# Patient Record
Sex: Male | Born: 1975 | Race: White | Hispanic: No | Smoking: Current every day smoker
Health system: Southern US, Community
[De-identification: ages and names within clinical notes are randomized; demographics above are authoritative.]

---

## 2005-01-02 ENCOUNTER — Emergency Department (HOSPITAL_COMMUNITY): Admission: EM | Admit: 2005-01-02 | Discharge: 2005-01-02 | Payer: Self-pay | Admitting: Family Medicine

## 2009-08-25 ENCOUNTER — Encounter: Payer: Self-pay | Admitting: Emergency Medicine

## 2009-08-25 ENCOUNTER — Inpatient Hospital Stay (HOSPITAL_COMMUNITY): Admission: EM | Admit: 2009-08-25 | Discharge: 2009-08-26 | Payer: Self-pay | Admitting: Internal Medicine

## 2009-08-25 ENCOUNTER — Ambulatory Visit: Payer: Self-pay | Admitting: Diagnostic Radiology

## 2010-06-15 LAB — POCT TOXICOLOGY PANEL: Cocaine: POSITIVE

## 2010-06-15 LAB — COMPREHENSIVE METABOLIC PANEL
ALT: 14 U/L (ref 0–53)
AST: 18 U/L (ref 0–37)
Albumin: 3 g/dL — ABNORMAL LOW (ref 3.5–5.2)
Alkaline Phosphatase: 52 U/L (ref 39–117)
BUN: 9 mg/dL (ref 6–23)
CO2: 26 mEq/L (ref 19–32)
Calcium: 8.4 mg/dL (ref 8.4–10.5)
Chloride: 108 mEq/L (ref 96–112)
Creatinine, Ser: 1.02 mg/dL (ref 0.4–1.5)
GFR calc Af Amer: >60 mL/min (ref >60)
GFR calc non Af Amer: >60 mL/min (ref >60)
Glucose, Bld: 107 mg/dL — ABNORMAL HIGH (ref 70–99)
Potassium: 3.6 mEq/L (ref 3.5–5.1)
Sodium: 137 mEq/L (ref 135–145)
Total Bilirubin: 0.9 mg/dL (ref 0.3–1.2)
Total Protein: 5.2 g/dL — ABNORMAL LOW (ref 6.0–8.3)

## 2010-06-15 LAB — CBC
HCT: 39.7 % (ref 39.0–52.0)
HCT: 42.5 % (ref 39.0–52.0)
Hemoglobin: 13.6 g/dL (ref 13.0–17.0)
Hemoglobin: 14.3 g/dL (ref 13.0–17.0)
MCHC: 33.6 g/dL (ref 30.0–36.0)
MCHC: 34.4 g/dL (ref 30.0–36.0)
MCV: 96.6 fL (ref 78.0–100.0)
MCV: 99.2 fL (ref 78.0–100.0)
Platelets: 197 10*3/uL (ref 150–400)
Platelets: 265 10*3/uL (ref 150–400)
RBC: 4 MIL/uL — ABNORMAL LOW (ref 4.22–5.81)
RBC: 4.4 MIL/uL (ref 4.22–5.81)
RDW: 11.9 % (ref 11.5–15.5)
RDW: 12.7 % (ref 11.5–15.5)
WBC: 6.7 10*3/uL (ref 4.0–10.5)
WBC: 8.1 10*3/uL (ref 4.0–10.5)

## 2010-06-15 LAB — HEPATIC FUNCTION PANEL
ALT: 13 U/L (ref 0–53)
AST: 27 U/L (ref 0–37)
Albumin: 4 g/dL (ref 3.5–5.2)
Alkaline Phosphatase: 69 U/L (ref 39–117)
Bilirubin, Direct: 0 mg/dL (ref 0.0–0.3)
Indirect Bilirubin: 0.4 mg/dL (ref 0.3–0.9)
Total Bilirubin: 0.4 mg/dL (ref 0.3–1.2)
Total Protein: 7.1 g/dL (ref 6.0–8.3)

## 2010-06-15 LAB — BASIC METABOLIC PANEL
BUN: 7 mg/dL (ref 6–23)
CO2: 25 mEq/L (ref 19–32)
Calcium: 9 mg/dL (ref 8.4–10.5)
Chloride: 106 mEq/L (ref 96–112)
Creatinine, Ser: 1.1 mg/dL (ref 0.4–1.5)
GFR calc Af Amer: >60 mL/min (ref >60)
GFR calc non Af Amer: >60 mL/min (ref >60)
Glucose, Bld: 135 mg/dL — ABNORMAL HIGH (ref 70–99)
Potassium: 3.5 mEq/L (ref 3.5–5.1)
Sodium: 146 mEq/L — ABNORMAL HIGH (ref 135–145)

## 2010-06-15 LAB — DIFFERENTIAL
Basophils Absolute: 0.1 10*3/uL (ref 0.0–0.1)
Basophils Absolute: 0.1 10*3/uL (ref 0.0–0.1)
Basophils Relative: 1 % (ref 0–1)
Basophils Relative: 1 % (ref 0–1)
Eosinophils Absolute: 0 10*3/uL (ref 0.0–0.7)
Eosinophils Absolute: 0.5 10*3/uL (ref 0.0–0.7)
Eosinophils Relative: 0 % (ref 0–5)
Eosinophils Relative: 7 % — ABNORMAL HIGH (ref 0–5)
Lymphocytes Relative: 15 % (ref 12–46)
Lymphocytes Relative: 35 % (ref 12–46)
Lymphs Abs: 1.2 10*3/uL (ref 0.7–4.0)
Lymphs Abs: 2.4 10*3/uL (ref 0.7–4.0)
Monocytes Absolute: 0.4 10*3/uL (ref 0.1–1.0)
Monocytes Absolute: 0.6 10*3/uL (ref 0.1–1.0)
Monocytes Relative: 10 % (ref 3–12)
Monocytes Relative: 5 % (ref 3–12)
Neutro Abs: 3.2 10*3/uL (ref 1.7–7.7)
Neutro Abs: 6.4 10*3/uL (ref 1.7–7.7)
Neutrophils Relative %: 47 % (ref 43–77)
Neutrophils Relative %: 79 % — ABNORMAL HIGH (ref 43–77)

## 2010-06-15 LAB — CARDIAC PANEL(CRET KIN+CKTOT+MB+TROPI)
CK, MB: 1 ng/mL (ref 0.3–4.0)
CK, MB: 1 ng/mL (ref 0.3–4.0)
CK, MB: 1.1 ng/mL (ref 0.3–4.0)
Relative Index: 0.7 (ref 0.0–2.5)
Relative Index: 0.7 (ref 0.0–2.5)
Relative Index: 1 (ref 0.0–2.5)
Total CK: 114 U/L (ref 7–232)
Total CK: 134 U/L (ref 7–232)
Total CK: 139 U/L (ref 7–232)
Troponin I: 0.01 ng/mL (ref 0.00–0.06)
Troponin I: <0.01 ng/mL (ref 0.00–0.06)
Troponin I: <0.01 ng/mL (ref 0.00–0.06)

## 2010-06-15 LAB — POCT CARDIAC MARKERS
CKMB, poc: <1 ng/mL — ABNORMAL LOW (ref 1.0–8.0)
CKMB, poc: <1 ng/mL — ABNORMAL LOW (ref 1.0–8.0)
CKMB, poc: <1 ng/mL — ABNORMAL LOW (ref 1.0–8.0)
Myoglobin, poc: 29.6 ng/mL (ref 12–200)
Myoglobin, poc: 30 ng/mL (ref 12–200)
Myoglobin, poc: 54.7 ng/mL (ref 12–200)
Troponin i, poc: <0.05 ng/mL (ref 0.00–0.09)
Troponin i, poc: <0.05 ng/mL (ref 0.00–0.09)
Troponin i, poc: <0.05 ng/mL (ref 0.00–0.09)

## 2010-06-15 LAB — MRSA PCR SCREENING: MRSA by PCR: NEGATIVE

## 2010-06-15 LAB — MAGNESIUM: Magnesium: 1.9 mg/dL (ref 1.5–2.5)

## 2010-06-15 LAB — GLUCOSE, CAPILLARY: Glucose-Capillary: 98 mg/dL (ref 70–99)

## 2010-06-15 LAB — ETHANOL: Alcohol, Ethyl (B): 156 mg/dL — ABNORMAL HIGH (ref 0–10)

## 2011-12-17 IMAGING — CR DG CHEST 1V PORT
1 series · 1 of 1 positions shown · non-contrast
Comparison: None.

CLINICAL DATA: Chest pain.

PORTABLE CHEST - 1 VIEW

[view not recorded]
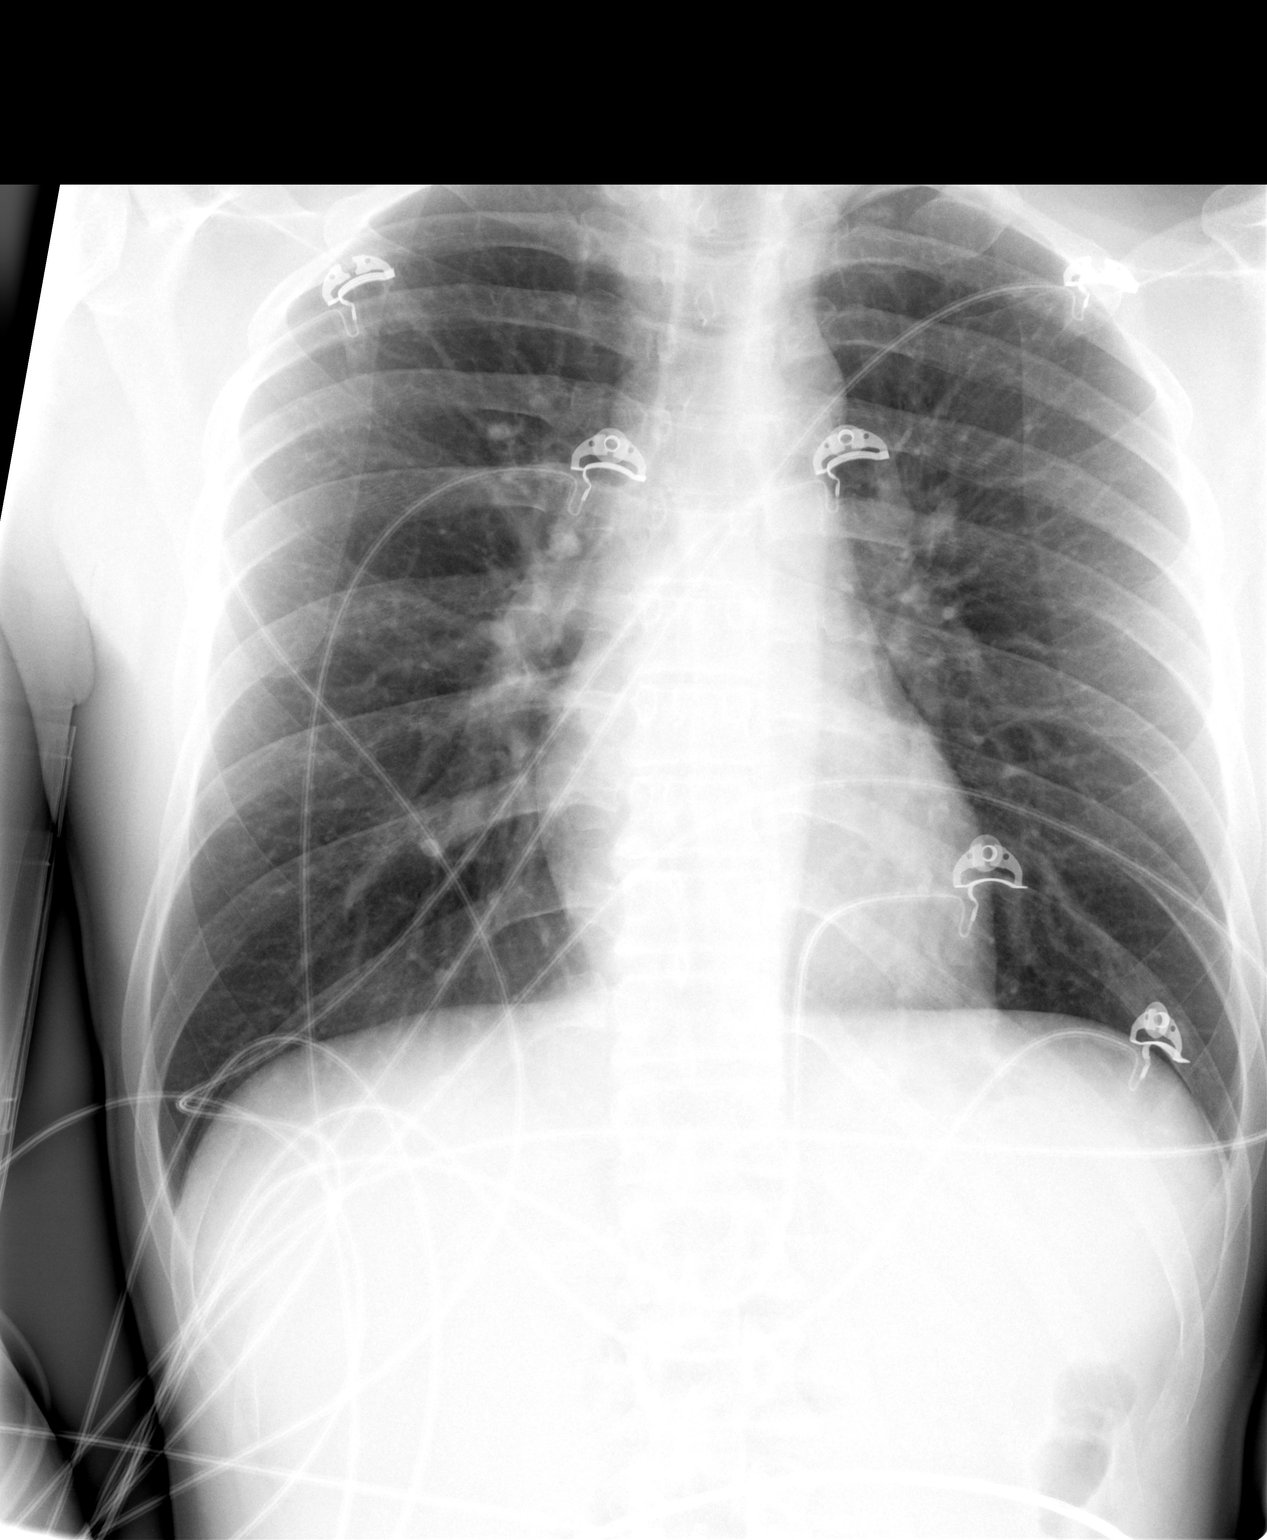

[1 of 1 positions shown; findings below may reference images not displayed]

FINDINGS: Lungs are clear.  No pneumothorax or pleural effusion.
Heart size normal.
IMPRESSION: No acute disease.

## 2016-10-30 DIAGNOSIS — K610 Anal abscess: Secondary | ICD-10-CM | POA: Insufficient documentation

## 2016-10-30 DIAGNOSIS — F17200 Nicotine dependence, unspecified, uncomplicated: Secondary | ICD-10-CM | POA: Insufficient documentation

## 2016-10-31 ENCOUNTER — Encounter (HOSPITAL_COMMUNITY): Payer: Self-pay | Admitting: Emergency Medicine

## 2016-10-31 ENCOUNTER — Emergency Department (HOSPITAL_COMMUNITY)
Admission: EM | Admit: 2016-10-31 | Discharge: 2016-10-31 | Discharge status: Against Medical Advice | Payer: Self-pay | Attending: Emergency Medicine | Admitting: Emergency Medicine

## 2016-10-31 DIAGNOSIS — L0291 Cutaneous abscess, unspecified: Secondary | ICD-10-CM

## 2016-10-31 MED ORDER — KETOROLAC TROMETHAMINE 30 MG/ML IJ SOLN
30.0000 mg | Freq: Once | INTRAMUSCULAR | Status: AC
  Administered 2016-10-31: 30 mg via INTRAMUSCULAR
  Filled 2016-10-31: qty 1

## 2016-10-31 MED ORDER — DOXYCYCLINE HYCLATE 100 MG PO CAPS: 100.0000 mg | ORAL_CAPSULE | Freq: Two times a day (BID) | ORAL | 0 refills | Status: DC

## 2016-10-31 MED ORDER — LIDOCAINE HCL (PF) 1 % IJ SOLN
5.0000 mL | Freq: Once | INTRAMUSCULAR | Status: DC
  Filled 2016-10-31: qty 30

## 2016-10-31 MED ORDER — LIDOCAINE-EPINEPHRINE (PF) 2 %-1:200000 IJ SOLN
INTRAMUSCULAR | Status: AC
  Administered 2016-10-31: 06:00:00
  Filled 2016-10-31: qty 20

## 2016-10-31 MED ORDER — OXYCODONE-ACETAMINOPHEN 5-325 MG PO TABS
1.0000 | ORAL_TABLET | Freq: Once | ORAL | Status: AC
  Administered 2016-10-31: 1 via ORAL
  Filled 2016-10-31: qty 1

## 2016-10-31 NOTE — ED Notes (Signed)
Bed: [facility] Expected date:  Expected time:  Means of arrival:  Comments: 

## 2016-10-31 NOTE — ED Provider Notes (Signed)
[facility]-EMERGENCY DEPT Provider Note   CSN: [id number] Arrival date & time: 10/30/16  2245     History   Chief Complaint Chief Complaint  Patient presents with  . Abscess    HPI Allen Flores is a 41 y.o. male.  HPI 41 year old Caucasian male with no significant past medical history presents to the ED today with complaints of abscess to his left buttocks. Patient states that he knows the area 3-4 days ago has gradually worsened. Reports drainage from the site. Reports erythema and pain. Denies any pain with defecation. Denies any rectal bleeding. The patient has not tried anything for his symptoms prior to arrival. Palpation and sitting makes the pain worse. He denies any associated symptoms including fever, chills, abdominal pain, nausea, emesis, diarrhea. History reviewed. No pertinent past medical history.  There are no active problems to display for this patient.   History reviewed. No pertinent surgical history.     Home Medications    Prior to Admission medications   Medication Sig Start Date End Date Taking? Authorizing Provider  doxycycline (VIBRAMYCIN) 100 MG capsule Take 1 capsule (100 mg total) by mouth 2 (two) times daily. 10/31/16   Rise MuLeaphart, Kenneth T, PA-C    Family History History reviewed. No pertinent family history.  Social History Social History  Substance Use Topics  . Smoking status: Current Every Day Smoker  . Smokeless tobacco: Never Used  . Alcohol use No     Allergies   Patient has no known allergies.   Review of Systems Review of Systems  Constitutional: Negative for chills and fever.  Gastrointestinal: Negative for abdominal pain, blood in stool, nausea, rectal pain and vomiting.  Skin: Positive for wound.     Physical Exam Updated Vital Signs BP 124/82 (BP Location: Left Arm)   Pulse 79   Temp 98.3 F (36.8 C) (Oral)   Resp 20   Ht 5\' 9"  (1.753 m)   Wt 68 kg (150 lb)   SpO2 95%   BMI 22.15 kg/m   Physical Exam    Constitutional: He appears well-developed and well-nourished. No distress.  HENT:  Head: Normocephalic and atraumatic.  Eyes: Right eye exhibits no discharge. Left eye exhibits no discharge. No scleral icterus.  Neck: Normal range of motion.  Pulmonary/Chest: No respiratory distress.  Genitourinary: Rectum normal.     Genitourinary Comments: A tender to palpation.  Musculoskeletal: Normal range of motion.  Neurological: He is alert.  Skin: Skin is warm and dry. Capillary refill takes less than 2 seconds. No pallor.  Psychiatric: His behavior is normal. Judgment and thought content normal.  Nursing note and vitals reviewed.    ED Treatments / Results  Labs (all labs ordered are listed, but only abnormal results are displayed) Labs Reviewed - No data to display  EKG  EKG Interpretation None       Radiology No results found.  Procedures .Marland Kitchen.Incision and Drainage Date/Time: 10/31/2016 7:01 AM Performed by: Demetrios LollLEAPHART, KENNETH T Authorized by: Demetrios LollLEAPHART, KENNETH T   Consent:    Consent obtained:  Verbal   Consent given by:  Patient   Risks discussed:  Bleeding, damage to other organs, incomplete drainage, infection and pain   Alternatives discussed:  No treatment Location:    Type:  Abscess   Size:  4x3cm   Location:  Anogenital   Anogenital location:  Perianal Pre-procedure details:    Skin preparation:  Betadine Anesthesia (see MAR for exact dosages):    Anesthesia method:  Local infiltration  Local anesthetic:  Lidocaine 1% WITH epi Procedure type:    Complexity:  Simple Procedure details:    Needle aspiration: no     Incision types:  Single straight   Incision depth:  Dermal   Scalpel blade:  11   Wound management:  Probed and deloculated, irrigated with saline and extensive cleaning   Drainage:  Bloody and purulent   Drainage amount:  Moderate   Wound treatment:  Wound left open   Packing materials:  1/4 in iodoform gauze Post-procedure details:     Patient tolerance of procedure:  Tolerated well, no immediate complications   (including critical care time)  Medications Ordered in ED Medications  lidocaine (PF) (XYLOCAINE) 1 % injection 5 mL (5 mLs Infiltration Not Given 10/31/16 0602)  lidocaine-EPINEPHrine (XYLOCAINE W/EPI) 2 %-1:200000 (PF) injection (  Given 10/31/16 0602)  oxyCODONE-acetaminophen (PERCOCET/ROXICET) 5-325 MG per tablet 1 tablet (1 tablet Oral Given 10/31/16 [id number])  ketorolac (TORADOL) 30 MG/ML injection 30 mg (30 mg Intramuscular Given 10/31/16 [id number])     Initial Impression / Assessment and Plan / ED Course  I have reviewed the triage vital signs and the nursing notes.  Pertinent labs & imaging results that were available during my care of the patient were reviewed by me and considered in my medical decision making (see chart for details).     Patient with skin abscess amenable to incision and drainage. There is no rectal involvement. No pain to palpation in the rectal vault. No pain with defecation. Doubt perirectal abscess. Abscess was large enough to warrant packing or drain,  wound recheck in 2 days. Abscess was irrigated. Signs of surrounding cellulitis. Patient was started on antibiotics with wound recheck in 2 days or sooner if symptoms worsen. Encouraged home warm soaks and flushing.   Pt is hemodynamically stable, in NAD, & able to ambulate in the ED. Evaluation does not show pathology that would require ongoing emergent intervention or inpatient treatment. I explained the diagnosis to the patient. Pain has been managed & has no complaints prior to dc. Pt is comfortable with above plan and is stable for discharge at this time. All questions were answered prior to disposition. Strict return precautions for f/u to the ED were discussed. Encouraged follow up with PCP. Dicussed pt with Dr. Lynelle DoctorKnapp who is agreeable to the above plan.   Final Clinical Impressions(s) / ED Diagnoses   Final diagnoses:  Abscess    New  Prescriptions Discharge Medication List as of 10/31/2016  6:16 AM    START taking these medications   Details  doxycycline (VIBRAMYCIN) 100 MG capsule Take 1 capsule (100 mg total) by mouth 2 (two) times daily., Starting Sun 10/31/2016, Print         Rise MuLeaphart, Kenneth T, PA-C 10/31/16 [id number]    Devoria AlbeKnapp, Iva, MD 10/31/16 [phone]

## 2016-10-31 NOTE — ED Notes (Signed)
No reaction to medication noted alert and oriented x 3 call light in reach no respiratory or acute distress noted. 

## 2016-10-31 NOTE — ED Notes (Signed)
No respiratory or acute distress noted alert and oriented x 3 call light in reach no pain voiced at this time.

## 2016-10-31 NOTE — ED Triage Notes (Signed)
Pt states he has an abscess on his buttock area that started 3 - 4 days ago

## 2016-10-31 NOTE — Discharge Instructions (Signed)
You have been treated for an abscess in the ED.  ° °There are sign of surrounding infection. Please take all of your antibiotics until finished!   You may develop abdominal discomfort or diarrhea from the antibiotic. You may help offset this with probiotics which you can buy or get in yogurt.  ° °May take tylenol and motrin as needed for pain. Warm compress to the area to help with the healing process. Avoid swimming for 1 week. May soak in warm water to help with pain and the healing process.  ° °Follow up with your doctor, an urgent care, or return to ED in order to remove your packing in 48-72 hours. If you do not have packing return in 48-72 hours for wound recheck. Return to the emergency department if you develop a fever, your abscess appears to become more infected (growing surrounding redness and warmth), new or worsening symptoms develop, any additional concerns.  ° °Abscess °An abscess (boil or furuncle) is an infected area that contains a collection of pus.  ° °SYMPTOMS °Signs and symptoms of an abscess include pain, tenderness, redness, or hardness. You may feel a moveable soft area under your skin. An abscess can occur anywhere in the body.  ° °TREATMENT  °A surgical cut (incision) may be made over your abscess to drain the pus. Gauze may be packed into the space or a drain may be looped through the abscess cavity (pocket). This provides a drain that will allow the cavity to heal from the inside outwards. The abscess may be painful for a few days, but should feel much better if it was drained.  °Your abscess, if seen early, may not have localized and may not have been drained. If not, another appointment may be required if it does not get better on its own or with medications. ° °HOME CARE INSTRUCTIONS  °Keep the skin and clothes clean around your abscess.  °If the abscess was drained, you will need to use gauze dressing to collect any draining pus. Dressings will typically need to be changed 3 or more  times a day.  °The infection may spread by skin contact with others. Avoid skin contact as much as possible.  °Practice good hygiene. This includes regular hand washing, cover any draining skin lesions, and do not share personal care items.  °SEEK MEDICAL CARE IF:  °You develop increased pain, swelling, redness, drainage, or bleeding in the wound site.  °You develop signs of generalized infection including muscle aches, chills, fever, or a general ill feeling.  °You have an oral temperature above 102° F (38.9° C).  °MAKE SURE YOU:  °Understand these instructions.  °Will watch your condition.  °Will get help right away if you are not doing well or get worse.  °Document Released: 12/23/2004 Document Revised: 11/25/2010 Document Reviewed: 10/17/2007 °[employer]® Patient Information ©2012 [employer]. ° ° ° °

## 2017-04-21 ENCOUNTER — Ambulatory Visit (HOSPITAL_COMMUNITY)
Admission: EM | Admit: 2017-04-21 | Discharge: 2017-04-21 | Disposition: A | Discharge status: Home / Self Care | Payer: Self-pay | Attending: Family Medicine | Admitting: Family Medicine

## 2017-04-21 ENCOUNTER — Encounter (HOSPITAL_COMMUNITY): Payer: Self-pay | Admitting: Emergency Medicine

## 2017-04-21 DIAGNOSIS — R69 Illness, unspecified: Secondary | ICD-10-CM

## 2017-04-21 DIAGNOSIS — J111 Influenza due to unidentified influenza virus with other respiratory manifestations: Secondary | ICD-10-CM

## 2017-04-21 MED ORDER — OSELTAMIVIR PHOSPHATE 75 MG PO CAPS: 75.0000 mg | ORAL_CAPSULE | Freq: Two times a day (BID) | ORAL | 0 refills | Status: DC

## 2017-04-21 MED ORDER — HYDROCODONE-HOMATROPINE 5-1.5 MG/5ML PO SYRP: 5.0000 mL | ORAL_SOLUTION | Freq: Four times a day (QID) | ORAL | 0 refills | Status: DC | PRN

## 2017-04-21 NOTE — ED Triage Notes (Signed)
PT reports  A chronic cough.  PT reports last night he developed a fever headaches, body aches, and vomiting.

## 2017-04-21 NOTE — Discharge Instructions (Signed)

## 2017-04-26 NOTE — ED Provider Notes (Signed)
  [facility]   [id number] 04/21/17 Arrival Time: 1505  ASSESSMENT & PLAN:  1. Influenza-like illness     Meds ordered this encounter  Medications  . oseltamivir (TAMIFLU) 75 MG capsule    Sig: Take 1 capsule (75 mg total) by mouth every 12 (twelve) hours.    Dispense:  10 capsule    Refill:  0  . HYDROcodone-homatropine (HYCODAN) 5-1.5 MG/5ML syrup    Sig: Take 5 mLs by mouth every 6 (six) hours as needed for cough.    Dispense:  90 mL    Refill:  0   Cough medication sedation precautions. Discussed typical duration of symptoms. OTC symptom care as needed. Ensure adequate fluid intake and rest. May f/u with PCP or here as needed.  Reviewed expectations re: course of current medical issues. Questions answered. Outlined signs and symptoms indicating need for more acute intervention. Patient verbalized understanding. After Visit Summary given.   SUBJECTIVE: History from: patient.  Allen Flores is a 42 y.o. male who presents with complaint of nasal congestion, post-nasal drainage, and a persistent dry cough. Onset abrupt, approximately 1 day ago. Overall fatigued with body aches. SOB: none. Wheezing: none. Fever: questions subjective with headache. Overall normal PO intake without n/v. Sick contacts: no. OTC treatment: none. No flu shot this year.  Social History   Tobacco Use  Smoking Status Current Every Day Smoker  . Packs/day: 0.50  Smokeless Tobacco Never Used    ROS: As per HPI.   OBJECTIVE:  Vitals:   04/21/17 1540  BP: 133/67  Pulse: (!) 118  Resp: 16  Temp: (!) 101.3 F (38.5 C)  TempSrc: Oral  SpO2: 94%  Weight: 144 lb (65.3 kg)  Height: 5\' 9"  (1.753 m)     General appearance: alert; appears fatigued HEENT: nasal congestion; clear runny nose; throat irritation secondary to post-nasal drainage Neck: supple without LAD Lungs: unlabored respirations, symmetrical air entry; cough: moderate; no respiratory distress Skin: warm and  dry Psychological: alert and cooperative; normal mood and affect  No Known Allergies  Social History   Socioeconomic History  . Marital status: Divorced    Spouse name: Not on file  . Number of children: Not on file  . Years of education: Not on file  . Highest education level: Not on file  Social Needs  . Financial resource strain: Not on file  . Food insecurity - worry: Not on file  . Food insecurity - inability: Not on file  . Transportation needs - medical: Not on file  . Transportation needs - non-medical: Not on file  Occupational History  . Not on file  Tobacco Use  . Smoking status: Current Every Day Smoker    Packs/day: 0.50  . Smokeless tobacco: Never Used  Substance and Sexual Activity  . Alcohol use: Yes  . Drug use: No  . Sexual activity: Not on file  Other Topics Concern  . Not on file  Social History Narrative  . Not on file           Mardella LaymanHagler, Temesha Queener, MD 04/26/17 [phone]

## 2020-02-14 ENCOUNTER — Other Ambulatory Visit: Payer: Self-pay

## 2020-02-14 DIAGNOSIS — Z20822 Contact with and (suspected) exposure to covid-19: Secondary | ICD-10-CM

## 2020-02-15 LAB — SARS-COV-2, NAA 2 DAY TAT

## 2020-02-15 LAB — NOVEL CORONAVIRUS, NAA: SARS-CoV-2, NAA: NOT DETECTED

## 2021-12-06 ENCOUNTER — Other Ambulatory Visit: Payer: Self-pay

## 2021-12-06 ENCOUNTER — Emergency Department (HOSPITAL_COMMUNITY)
Admission: EM | Admit: 2021-12-06 | Discharge: 2021-12-06 | Disposition: A | Discharge status: Home / Self Care | Payer: Self-pay | Attending: Emergency Medicine | Admitting: Emergency Medicine

## 2021-12-06 ENCOUNTER — Encounter (HOSPITAL_COMMUNITY): Payer: Self-pay | Admitting: Emergency Medicine

## 2021-12-06 ENCOUNTER — Emergency Department (HOSPITAL_COMMUNITY): Payer: Self-pay

## 2021-12-06 DIAGNOSIS — W540XXA Bitten by dog, initial encounter: Secondary | ICD-10-CM | POA: Insufficient documentation

## 2021-12-06 DIAGNOSIS — T148XXA Other injury of unspecified body region, initial encounter: Secondary | ICD-10-CM

## 2021-12-06 DIAGNOSIS — L03116 Cellulitis of left lower limb: Secondary | ICD-10-CM

## 2021-12-06 LAB — CBC WITH DIFFERENTIAL/PLATELET
Abs Immature Granulocytes: 0.07 10*3/uL (ref 0.00–0.07)
Basophils Absolute: 0.1 10*3/uL (ref 0.0–0.1)
Basophils Relative: 0 %
Eosinophils Absolute: 0.2 10*3/uL (ref 0.0–0.5)
Eosinophils Relative: 2 %
HCT: 42.9 % (ref 39.0–52.0)
Hemoglobin: 14.3 g/dL (ref 13.0–17.0)
Immature Granulocytes: 1 %
Lymphocytes Relative: 11 %
Lymphs Abs: 1.3 10*3/uL (ref 0.7–4.0)
MCH: 33.5 pg (ref 26.0–34.0)
MCHC: 33.3 g/dL (ref 30.0–36.0)
MCV: 100.5 fL — ABNORMAL HIGH (ref 80.0–100.0)
Monocytes Absolute: 1.1 10*3/uL — ABNORMAL HIGH (ref 0.1–1.0)
Monocytes Relative: 9 %
Neutro Abs: 9.1 10*3/uL — ABNORMAL HIGH (ref 1.7–7.7)
Neutrophils Relative %: 77 %
Platelets: 248 10*3/uL (ref 150–400)
RBC: 4.27 MIL/uL (ref 4.22–5.81)
RDW: 12.2 % (ref 11.5–15.5)
WBC: 11.8 10*3/uL — ABNORMAL HIGH (ref 4.0–10.5)
nRBC: 0 % (ref 0.0–0.2)

## 2021-12-06 LAB — COMPREHENSIVE METABOLIC PANEL
ALT: 19 U/L (ref 0–44)
AST: 19 U/L (ref 15–41)
Albumin: 4.2 g/dL (ref 3.5–5.0)
Alkaline Phosphatase: 64 U/L (ref 38–126)
Anion gap: 7 (ref 5–15)
BUN: 16 mg/dL (ref 6–20)
CO2: 25 mmol/L (ref 22–32)
Calcium: 8.9 mg/dL (ref 8.9–10.3)
Chloride: 107 mmol/L (ref 98–111)
Creatinine, Ser: 0.94 mg/dL (ref 0.61–1.24)
GFR, Estimated: >60 mL/min (ref >60)
Glucose, Bld: 100 mg/dL — ABNORMAL HIGH (ref 70–99)
Potassium: 3.6 mmol/L (ref 3.5–5.1)
Sodium: 139 mmol/L (ref 135–145)
Total Bilirubin: 0.6 mg/dL (ref 0.3–1.2)
Total Protein: 7.2 g/dL (ref 6.5–8.1)

## 2021-12-06 MED ORDER — BACITRACIN ZINC 500 UNIT/GM EX OINT
TOPICAL_OINTMENT | CUTANEOUS | Status: AC
  Filled 2021-12-06: qty 1.8

## 2021-12-06 MED ORDER — AMOXICILLIN-POT CLAVULANATE 875-125 MG PO TABS: 1.0000 | ORAL_TABLET | Freq: Two times a day (BID) | ORAL | 0 refills | Status: AC

## 2021-12-06 MED ORDER — MUPIROCIN 2 % EX OINT: 1.0000 | TOPICAL_OINTMENT | Freq: Two times a day (BID) | CUTANEOUS | 0 refills | Status: AC

## 2021-12-06 NOTE — ED Triage Notes (Signed)
Pt reports dog bite on right leg. Reports stepping on something w/ right foot as well. Leg is now swollen and red.

## 2021-12-06 NOTE — ED Provider Notes (Signed)
[facility]-EMERGENCY DEPT Provider Note   CSN: [id number] Arrival date & time: 12/06/21  1323     History  Chief Complaint  Patient presents with   Leg Pain   Animal Bite    Allen Flores is a 46 y.o. male.   Leg Pain Animal Bite   46 year old male presents emergency department with complaints of left leg swelling and pain.  Patient states that he was bit by a dog this past Wednesday.  Dog is up-to-date on rabies vaccination per patient.  He also notes he stepped on an object in the woods 2 days ago that caused a cut on the plantar aspect of his left foot.  Yesterday he noticed redness in his left calf that is since progressed.  Area is tender to the touch per patient.  Denies fever, chills, night sweats, weakness/sensory deficits in affected leg, drainage from sites.  He has been putting hydrogen peroxide as well as Neosporin on areas.  Past medical history significant for chronic cigarette use  Home Medications Prior to Admission medications   Medication Sig Start Date End Date Taking? Authorizing Provider  amoxicillin-clavulanate (AUGMENTIN) 875-125 MG tablet Take 1 tablet by mouth every 12 (twelve) hours. 12/06/21  Yes Sherian Maroon A, PA  mupirocin ointment (BACTROBAN) 2 % Apply 1 Application topically 2 (two) times daily. 12/06/21  Yes Peter Garter, PA      Allergies    Patient has no known allergies.    Review of Systems   Review of Systems  All other systems reviewed and are negative.   Physical Exam Updated Vital Signs BP 123/84   Pulse 98   Temp 98.3 F (36.8 C) (Oral)   Resp 16   SpO2 99%  Physical Exam Vitals and nursing note reviewed.  Constitutional:      General: He is not in acute distress.    Appearance: Normal appearance. He is well-developed.  HENT:     Head: Normocephalic and atraumatic.  Eyes:     Conjunctiva/sclera: Conjunctivae normal.  Cardiovascular:     Rate and Rhythm: Normal rate and regular rhythm.      Heart sounds: No murmur heard. Pulmonary:     Effort: Pulmonary effort is normal. No respiratory distress.     Breath sounds: Normal breath sounds.  Abdominal:     Palpations: Abdomen is soft.     Tenderness: There is no abdominal tenderness.  Musculoskeletal:        General: No swelling.     Cervical back: Neck supple.  Skin:    General: Skin is warm and dry.     Capillary Refill: Capillary refill takes less than 2 seconds.     Comments: Patient has 2.5 cm superficial abrasion on the plantar aspect of left foot.  No surrounding fluctuance, erythema.  Area is minimally tender to palpation.  Maceration of skin noted secondary to prolonged Band-Aid placement.  No active drainage from site.  Patient has 1.5 cm diameter puncture wound noted on the posterior aspect of superior left calf.  No active drainage from site.  Area relatively clean in appearance with no observable foreign body.  Area of erythema extends from said puncture site distally down to his mid calf.  Area is warm to the touch and tender to palpation.  No obvious palpable nodule cord.  No obvious area of fluctuance/indurated skin. Patient is full active range of motion of lower extremity.  Dorsalis pedis pulses full and intact bilaterally.  Patient claims no  sensory deficits along major nerve distributions of lower extremities.  Compartments soft and supple.  Neurological:     Mental Status: He is alert.  Psychiatric:        Mood and Affect: Mood normal.     ED Results / Procedures / Treatments   Labs (all labs ordered are listed, but only abnormal results are displayed) Labs Reviewed  COMPREHENSIVE METABOLIC PANEL - Abnormal; Notable for the following components:      Result Value   Glucose, Bld 100 (*)    All other components within normal limits  CBC WITH DIFFERENTIAL/PLATELET - Abnormal; Notable for the following components:   WBC 11.8 (*)    MCV 100.5 (*)    Neutro Abs 9.1 (*)    Monocytes Absolute 1.1 (*)    All  other components within normal limits    EKG None  Radiology DG Foot Complete Left  Result Date: 12/06/2021 CLINICAL DATA:  Puncture wound along the medial plantar aspect of foot. Question foreign body EXAM: LEFT FOOT - COMPLETE 3 VIEW COMPARISON:  None Available. FINDINGS: There is no evidence of fracture or dislocation. There is no evidence of arthropathy or other focal bone abnormality. Soft tissues are unremarkable. No radiopaque foreign body is present. IMPRESSION: Negative left foot radiographs. Electronically Signed   By: Marin Roberts M.D.   On: 12/06/2021 14:19    Procedures Procedures    Medications Ordered in ED Medications  bacitracin 500 UNIT/GM ointment (has no administration in time range)    ED Course/ Medical Decision Making/ A&P                           Medical Decision Making Amount and/or Complexity of Data Reviewed Labs: ordered. Radiology: ordered.   This patient presents to the ED for concern of left leg pain, this involves an extensive number of treatment options, and is a complaint that carries with it a high risk of complications and morbidity.  The differential diagnosis includes foreign body, abscess, erysipelas, cellulitis, DVT, PAD, necrotizing fasciitis, compartment syndrome, fracture, strain/sprain   Co morbidities that complicate the patient evaluation  See HPI   Additional history obtained:  Additional history obtained from EMR   Lab Tests:  I Ordered, and personally interpreted labs.  The pertinent results include: Mild leukocytosis of 11.8 secondary to clinical diagnosis of cellulitis.  No electrolyte abnormality noted.  Renal function within normal limits.  No transaminitis noted.  No evidence of anemia.  Platelets within normal range.   Imaging Studies ordered:  I ordered imaging studies including left foot x-ray I independently visualized and interpreted imaging which showed no obvious foreign body I agree with the  radiologist interpretation   Cardiac Monitoring: / EKG:  The patient was maintained on a cardiac monitor.  I personally viewed and interpreted the cardiac monitored which showed an underlying rhythm of: Sinus rhythm   Consultations Obtained:  N/a   Problem List / ED Course / Critical interventions / Medication management  Cellulitis I ordered medication including bacitracin for topical antibiotic therapy   Reevaluation of the patient after these medicines showed that the patient improved I have reviewed the patients home medicines and have made adjustments as needed   Social Determinants of Health:  Chronic cigarette use   Test / Admission - Considered:  Dog bite/cellulitis Vitals signs within normal range and stable throughout visit. Laboratory/imaging studies significant for: See above Patient symptoms likely secondary to use bacterial skin  infection secondary to puncture wound obtained a few days ago.  No obvious clinical suspicion for DVT/PAD given lack of lower extremity edema or palpable nodule/cord.  Area of erythema extends from bite wound as described above.  No obvious areas of fluctuance indicating abscess.  Area was cleaned thoroughly while in the emergency department with deep wound cleanser.  Topical antibiotic ointment was applied.  Patient to be prescribed oral antibiotics of Augmentin outpatient for broad-spectrum coverage of oral flora of canine.  Topical bacitracin also prescribed for topical antibiotic therapy.  Patient recommended reevaluation in 2 to 3 days as well as tracking progression/recession of erythema.  Treatment plan discussed with patient he acknowledged understanding was agreeable to said plan. Worrisome signs and symptoms were discussed with the patient, and the patient acknowledged understanding to return to the ED if noticed. Patient was stable upon discharge.          Final Clinical Impression(s) / ED Diagnoses Final diagnoses:  Animal  bite  Cellulitis of left lower extremity    Rx / DC Orders ED Discharge Orders          Ordered    mupirocin ointment (BACTROBAN) 2 %  2 times daily        12/06/21 1844    amoxicillin-clavulanate (AUGMENTIN) 875-125 MG tablet  Every 12 hours        12/06/21 1844              Peter Garter, PA 12/06/21 1853    Gerhard Munch, MD 12/06/21 [phone]

## 2021-12-06 NOTE — Discharge Instructions (Addendum)
Note your work-up today was overall consistent with cellulitis around where you are bit by the dog couple days back.  We will treat this with oral antibiotics as well as topical antibiotics over wound. As we discussed, we give 2-3 days to determine failure of antibiotics outpatient.  Keep an eye on the area and make sure redness does not extend beyond the current borders.  You can consider drawing line around it with a pen or marker to gauge expansion or shrinking.  Change bandages out 1-2 times daily.  Wash area with warm soapy water.  As we discussed, avoid using hydrogen peroxide to affected areas.  Please do not hesitate to return to the emergency department for worrisome signs and symptoms we discussed become apparent.

## 2021-12-06 NOTE — ED Provider Triage Note (Signed)
Emergency Medicine Provider Triage Evaluation Note  Molly Maselli , a 46 y.o. male  was evaluated in triage.  Pt complains of left leg swelling and pain.  Patient states that he was bit by a dog this past Wednesday.  Dog is his and is up-to-date on rabies vaccination per patient.  He also notes he stepped on an object in the woods 2 days ago that caused a laceration on the plantar aspect of his left foot.  Yesterday he noticed redness in his left calf that is since progressed.  Area is tender to the touch per patient.  Denies fever, chills, night sweats, weakness/sensory deficits in affected leg.  Review of Systems  Positive: See above Negative:   Physical Exam  BP 123/84   Pulse 98   Temp 98.3 F (36.8 C) (Oral)   Resp 16   SpO2 99%  Gen:   Awake, no distress   Resp:  Normal effort  MSK:   Moves extremities without difficulty  Other:  Area of erythema noted on the calf of his left lower leg.  Semicircular laceration noted on the plantar aspect of patient's left foot with no surrounding erythema.  He is concerned about possible foreign body.  Laceration noted in patient's left popliteal fossa.  Dorsalis pedis pulses full and intact bilaterally.  Medical Decision Making  Medically screening exam initiated at 1:52 PM.  Appropriate orders placed.  Azekiel Cremer was informed that the remainder of the evaluation will be completed by another provider, this initial triage assessment does not replace that evaluation, and the importance of remaining in the ED until their evaluation is complete.     Peter Garter, [place] 12/06/21 1354
